# Patient Record
Sex: Female | Born: 1950
Health system: Southern US, Community
[De-identification: ages and names within clinical notes are randomized; demographics above are authoritative.]

---

## 1999-07-06 ENCOUNTER — Ambulatory Visit (HOSPITAL_COMMUNITY): Admission: RE | Admit: 1999-07-06 | Discharge: 1999-07-06 | Payer: Self-pay | Admitting: Family Medicine

## 1999-07-06 ENCOUNTER — Encounter: Payer: Self-pay | Admitting: Family Medicine

## 1999-07-23 ENCOUNTER — Other Ambulatory Visit: Admission: RE | Admit: 1999-07-23 | Discharge: 1999-07-23 | Payer: Self-pay | Admitting: Family Medicine

## 2000-05-06 ENCOUNTER — Other Ambulatory Visit: Admission: RE | Admit: 2000-05-06 | Discharge: 2000-05-06 | Payer: Self-pay

## 2001-09-13 ENCOUNTER — Emergency Department (HOSPITAL_COMMUNITY): Admission: EM | Admit: 2001-09-13 | Discharge: 2001-09-14 | Payer: Self-pay | Admitting: Emergency Medicine

## 2002-05-01 ENCOUNTER — Ambulatory Visit (HOSPITAL_COMMUNITY): Admission: RE | Admit: 2002-05-01 | Discharge: 2002-05-01 | Payer: Self-pay | Admitting: Family Medicine

## 2002-05-01 ENCOUNTER — Encounter: Payer: Self-pay | Admitting: Family Medicine

## 2002-07-20 ENCOUNTER — Ambulatory Visit (HOSPITAL_COMMUNITY): Admission: RE | Admit: 2002-07-20 | Discharge: 2002-07-20 | Payer: Self-pay | Admitting: Gastroenterology

## 2005-10-07 ENCOUNTER — Ambulatory Visit (HOSPITAL_COMMUNITY): Admission: RE | Admit: 2005-10-07 | Discharge: 2005-10-08 | Payer: Self-pay | Admitting: Neurosurgery

## 2006-04-28 ENCOUNTER — Ambulatory Visit (HOSPITAL_COMMUNITY): Admission: RE | Admit: 2006-04-28 | Discharge: 2006-04-28 | Payer: Self-pay | Admitting: Family Medicine

## 2007-07-19 IMAGING — CR DG CERVICAL SPINE 2 OR 3 VIEWS
1 series · 1 of 1 positions shown · non-contrast
Comparison: none

CLINICAL DATA: 54-year-old female.  HNP.  Radiculopathy.  C6-7 decompression ACDF.
 CERVICAL SPINE ? 2 VIEW:

[view not recorded]
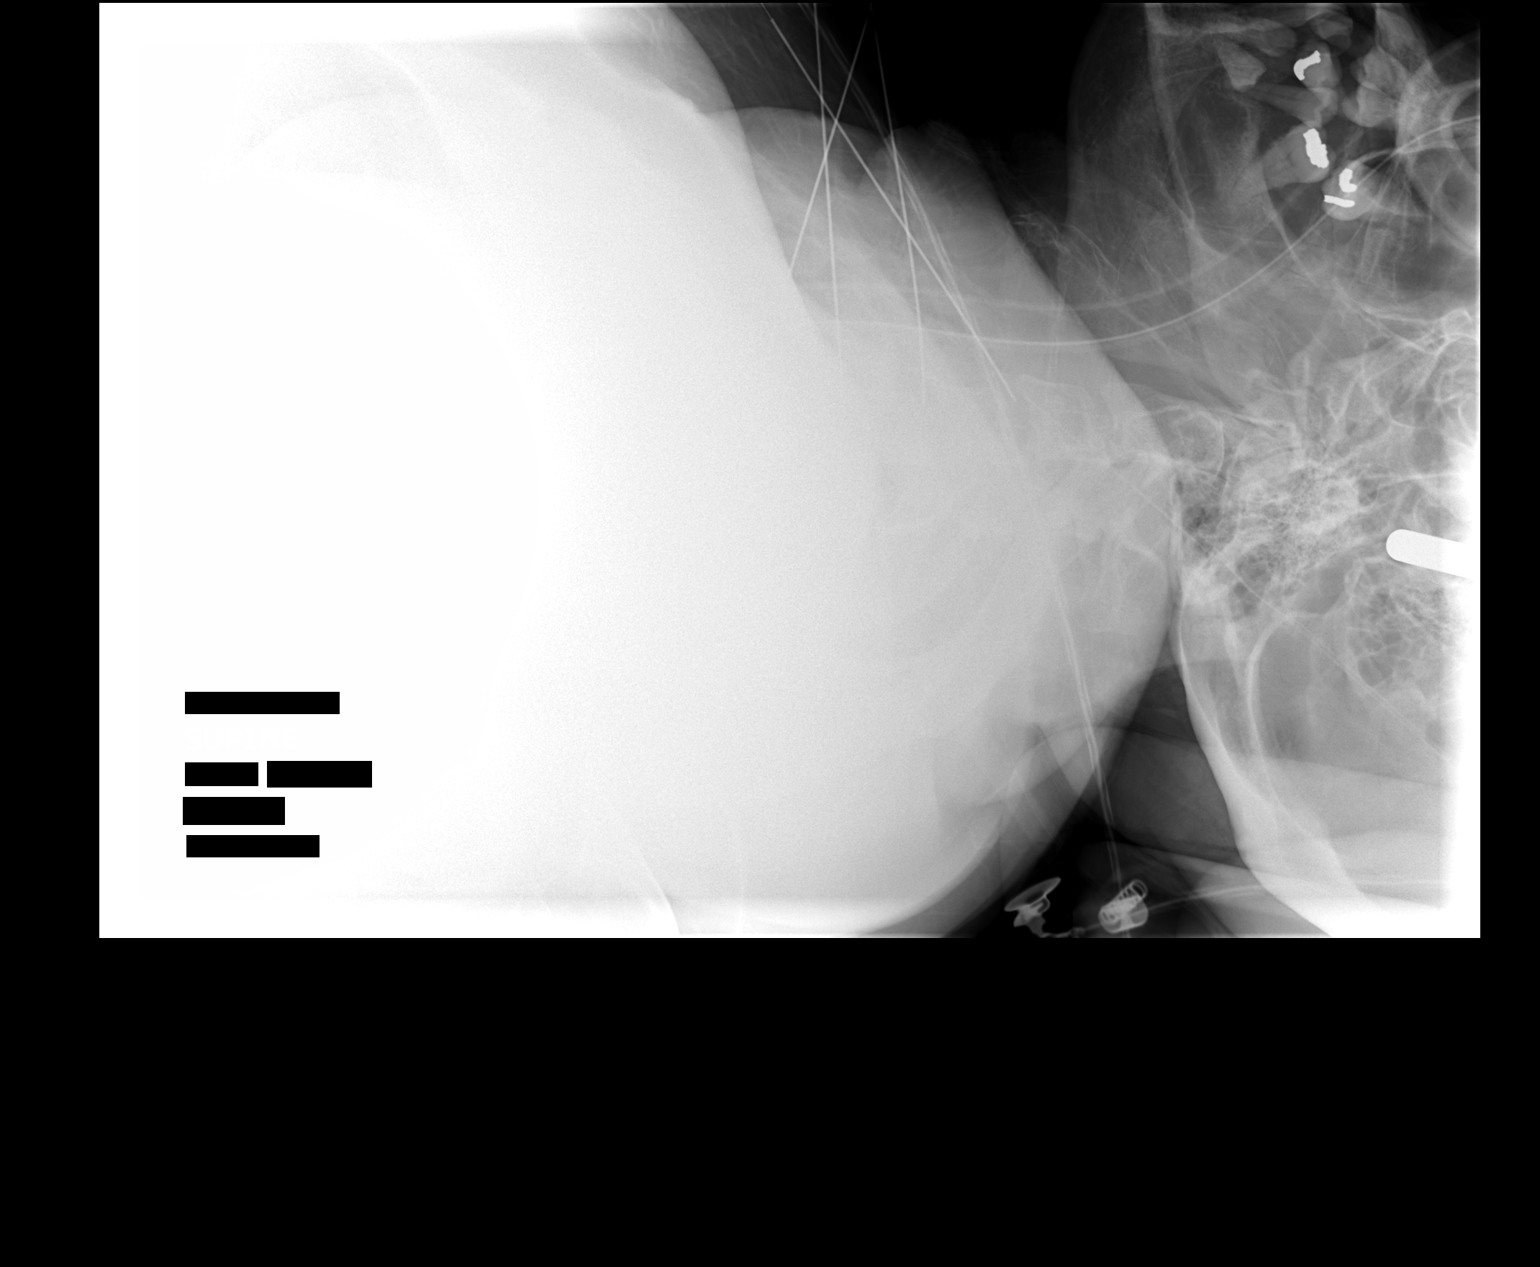

[1 of 1 positions shown; findings below may reference images not displayed]

FINDINGS: First view, the patient is intubated.  There is Grade I anterolisthesis C3 on 4.  Surgical probes are directed at 5-6 and presumably the C6-7 disc space although the lower disc spaces cannot be visualized due to patient?s shoulders and body habitus. 
 On the second film, additional probes or needles are directed at the C2-3 disc space as well as the C3-4 disc space.  The original needles appear to be intact.
IMPRESSION: Two view cervical spine for intraoperative localization purposes.

## 2019-06-22 ENCOUNTER — Ambulatory Visit: Payer: Medicare Other | Attending: Internal Medicine

## 2019-06-22 DIAGNOSIS — Z23 Encounter for immunization: Secondary | ICD-10-CM

## 2019-06-22 NOTE — Progress Notes (Signed)
   Covid-19 Vaccination Clinic  Name:  Karen Pace    MRN: [id number] DOB: [birth year]  06/22/2019  Karen Pace was observed post Covid-19 immunization for 15 minutes without incident. She was provided with Vaccine Information Sheet and instruction to access the V-Safe system.   Karen Pace was instructed to call 911 with any severe reactions post vaccine: Marland Kitchen Difficulty breathing  . Swelling of face and throat  . A fast heartbeat  . A bad rash all over body  . Dizziness and weakness   Immunizations Administered    Name Date Dose VIS Date Route   Pfizer COVID-19 Vaccine 06/22/2019 11:11 AM 0.3 mL 03/09/2019 Intramuscular   Manufacturer: [employer], [employer]   Lot: [id number]   NDC: [address]-7

## 2019-07-18 ENCOUNTER — Ambulatory Visit: Payer: Medicare Other | Attending: Internal Medicine

## 2019-07-18 DIAGNOSIS — Z23 Encounter for immunization: Secondary | ICD-10-CM

## 2019-07-18 NOTE — Progress Notes (Signed)
   Covid-19 Vaccination Clinic  Name:  Karen Pace    MRN: [id number] DOB: 02-05-1951  07/18/2019  [name] was observed post Covid-19 immunization for 15 minutes without incident. She was provided with Vaccine Information Sheet and instruction to access the V-Safe system.   [name] was instructed to call 911 with any severe reactions post vaccine: Marland Kitchen Difficulty breathing  . Swelling of face and throat  . A fast heartbeat  . A bad rash all over body  . Dizziness and weakness   Immunizations Administered    Name Date Dose VIS Date Route   Pfizer COVID-19 Vaccine 07/18/2019 10:47 AM 0.3 mL 05/23/2018 Intramuscular   Manufacturer: [employer], [employer]   Lot: [id number]   NDC: [address]-2

## 2020-01-14 ENCOUNTER — Other Ambulatory Visit (HOSPITAL_BASED_OUTPATIENT_CLINIC_OR_DEPARTMENT_OTHER): Payer: Self-pay | Admitting: Internal Medicine

## 2020-01-14 ENCOUNTER — Ambulatory Visit: Payer: Medicare Other | Attending: Internal Medicine

## 2020-01-14 DIAGNOSIS — Z23 Encounter for immunization: Secondary | ICD-10-CM

## 2020-01-14 NOTE — Progress Notes (Signed)
   Covid-19 Vaccination Clinic  Name:  Karen Pace    MRN: [id number] DOB: 06-04-1950  01/14/2020  [name] was observed post Covid-19 immunization for 15 minutes without incident. She was provided with Vaccine Information Sheet and instruction to access the V-Safe system.   [name] was instructed to call 911 with any severe reactions post vaccine: Marland Kitchen Difficulty breathing  . Swelling of face and throat  . A fast heartbeat  . A bad rash all over body  . Dizziness and weakness

## 2020-01-22 MED FILL — PFIZER-BIONTECH COVID-19 VA: 30 | 1 days supply | Qty: 0 | Fill #0
